# Patient Record
Sex: Female | Born: 1990 | Race: White | Hispanic: No | Marital: Married | Smoking: Never smoker
Health system: Southern US, Community
[De-identification: ages and names within clinical notes are randomized; demographics above are authoritative.]

## PROBLEM LIST (undated history)

## (undated) DIAGNOSIS — E8881 Metabolic syndrome: Secondary | ICD-10-CM

## (undated) DIAGNOSIS — R569 Unspecified convulsions: Secondary | ICD-10-CM

## (undated) DIAGNOSIS — E282 Polycystic ovarian syndrome: Secondary | ICD-10-CM

## (undated) HISTORY — DX: Metabolic syndrome: E88.81

## (undated) HISTORY — DX: Unspecified convulsions: R56.9

## (undated) HISTORY — DX: Metabolic syndrome: E88.810

## (undated) HISTORY — DX: Polycystic ovarian syndrome: E28.2

---
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
## 2013-01-28 ENCOUNTER — Ambulatory Visit (INDEPENDENT_AMBULATORY_CARE_PROVIDER_SITE_OTHER): Payer: BC Managed Care – PPO | Admitting: Physician Assistant

## 2013-01-28 VITALS — BP 136/79 | HR 114 | Temp 98.9°F

## 2013-01-28 DIAGNOSIS — S92301A Fracture of unspecified metatarsal bone(s), right foot, initial encounter for closed fracture: Secondary | ICD-10-CM

## 2013-01-28 DIAGNOSIS — S92309A Fracture of unspecified metatarsal bone(s), unspecified foot, initial encounter for closed fracture: Secondary | ICD-10-CM

## 2013-01-28 NOTE — Patient Instructions (Signed)
Foot Fracture Your caregiver has diagnosed you as having a foot fracture (broken bone). Your foot has many bones. You have a fracture, or break, in one of these bones. In some cases, your doctor may put on a splint or removable fracture boot until the swelling in your foot has lessened. A cast may or may not be required. HOME CARE INSTRUCTIONS  If you do not have a cast or splint:  You may bear weight on your injured foot as tolerated or advised.  Do not put any weight on your injured foot for as long as directed by your caregiver. Slowly increase the amount of time you walk on the foot as the pain and swelling allows or as advised.  Use crutches until you can bear weight without pain. A gradual increase in weight bearing may help.  Apply ice to the injury for 15-20 minutes each hour while awake for the first 2 days. Put the ice in a plastic bag and place a towel between the bag of ice and your skin.  If an ace bandage (stretchy, elastic wrapping bandage) was applied, you may re-wrap it if ankle is more painful or your toes become cold and swollen. If you have a cast or splint:  Use your crutches for as long as directed by your caregiver.  To lessen the swelling, keep the injured foot elevated on pillows while lying down or sitting. Elevate your foot above your heart.  Apply ice to the injury for 15-20 minutes each hour while awake for the first 2 days. Put the ice in a plastic bag and place a thin towel between the bag of ice and your cast.  Plaster or fiberglass cast:  Do not try to scratch the skin under the cast using a sharp or pointed object down the cast.  Check the skin around the cast every day. You may put lotion on any red or sore areas.  Keep your cast clean and dry.  Plaster splint:  Wear the splint until you are seen for a follow-up examination.  You may loosen the elastic around the splint if your toes become numb, tingle, or turn blue or cold. Do not rest it on  anything harder than a pillow in the first 24 hours.  Do not put pressure on any part of your splint. Use your crutches as directed.  Keep your splint dry. It can be protected during bathing with a plastic bag. Do not lower the splint into water.  If you have a fracture boot you may remove it to shower. Bear weight only as instructed by your caregiver.  Only take over-the-counter or prescription medicines for pain, discomfort, or fever as directed by your caregiver. SEEK IMMEDIATE MEDICAL CARE IF:   Your cast gets damaged or breaks.  You have continued severe pain or more swelling than you did before the cast was put on.  Your skin or nails of your casted foot turn blue, gray, feel cold or numb.  There is a bad smell from your cast.  There is severe pain with movement of your toes.  There are new stains and/or drainage coming from under the cast. MAKE SURE YOU:   Understand these instructions.  Will watch your condition.  Will get help right away if you are not doing well or get worse. Document Released: 07/29/2000 Document Revised: 10/24/2011 Document Reviewed: 09/04/2008 [employer] Patient Information 2014 [employer].  

## 2013-01-28 NOTE — Progress Notes (Signed)
Subjective:     Patient ID: Theresa Lloyd, female   DOB: 04-07-1991, 22 y.o.   MRN: [id number]  Foot Pain  Pt seen as [place] pt this am [place] pt was at beach and tripped coming down the last 2 steps She noted pain and swelling to the midfoot Due to cont sx was seen at Urgent Care/ER [place] the beach Informed she has a fx and was placed in splint Here for f/u She has had some pain to the heel in the splint   Review of Systems  All other systems reviewed and are negative.       Objective:   Physical Exam  Nursing note and vitals reviewed.  Removed splint and padded heel Good pulses + TTP mid foot + ecchy to the base of the 2-4th toes Good pulses/sensory Reviewed Xray results     Assessment:     1. Metatarsal bone fracture, right, closed, initial encounter        Plan:     Splint care reviewed Work note for pt Referral to Ortho F/U prn

## 2013-05-01 ENCOUNTER — Ambulatory Visit (INDEPENDENT_AMBULATORY_CARE_PROVIDER_SITE_OTHER): Payer: Medicaid Other | Admitting: Family Medicine

## 2013-05-01 ENCOUNTER — Encounter: Payer: Self-pay | Admitting: Family Medicine

## 2013-05-01 VITALS — BP 99/61 | HR 107 | Temp 97.3°F | Wt 309.2 lb

## 2013-05-01 DIAGNOSIS — K5289 Other specified noninfective gastroenteritis and colitis: Secondary | ICD-10-CM

## 2013-05-01 DIAGNOSIS — K529 Noninfective gastroenteritis and colitis, unspecified: Secondary | ICD-10-CM

## 2013-05-01 MED ORDER — ONDANSETRON HCL 8 MG PO TABS: 8.0000 mg | ORAL_TABLET | Freq: Three times a day (TID) | ORAL | Status: DC | PRN

## 2013-05-01 NOTE — Patient Instructions (Signed)

## 2013-05-01 NOTE — Progress Notes (Signed)
  Subjective:    Patient ID: Theresa Lloyd, female    DOB: January 30, 1991, 22 y.o.   MRN: [id number]  HPI This 22 y.o. female presents for evaluation of nausea vomiting, and diarrhea For 2 days.  She is [redacted] weeks pregnant.  She states the morning sickness Quit over 3 weeks ago.   Review of Systems C/o nausea and vomiting No chest pain, SOB, HA, dizziness, vision change, diarrhea, constipation, dysuria, urinary urgency or frequency, myalgias, arthralgias or rash.     Objective:   Physical Exam  Vital signs noted  Well developed well nourished female.  HEENT - Head atraumatic Normocephalic                Eyes - PERRLA, Conjuctiva - clear Sclera- Clear EOMI                Ears - EAC's Wnl TM's Wnl Gross Hearing WNL                Nose - Nares patent                 Throat - oropharanx wnl Respiratory - Lungs CTA bilateral Cardiac - RRR S1 and S2 without murmur GI - Abdomen soft Nontender and bowel sounds active x 4 Extremities - No edema. Neuro - Grossly intact.      Assessment & Plan:  Noninfectious gastroenteritis and colitis - Plan: ondansetron (ZOFRAN) 8 MG tablet Push po fluids, rest, and follow up prn

## 2014-06-16 ENCOUNTER — Encounter: Payer: Self-pay | Admitting: Family Medicine

## 2014-09-11 ENCOUNTER — Emergency Department (HOSPITAL_COMMUNITY)
Admission: EM | Admit: 2014-09-11 | Discharge: 2014-09-11 | Disposition: A | Discharge status: Home / Self Care | Payer: Self-pay | Attending: Emergency Medicine | Admitting: Emergency Medicine

## 2014-09-11 ENCOUNTER — Encounter (HOSPITAL_COMMUNITY): Payer: Self-pay | Admitting: Emergency Medicine

## 2014-09-11 ENCOUNTER — Emergency Department (HOSPITAL_COMMUNITY): Payer: Self-pay

## 2014-09-11 DIAGNOSIS — Z3202 Encounter for pregnancy test, result negative: Secondary | ICD-10-CM | POA: Insufficient documentation

## 2014-09-11 DIAGNOSIS — Z880 Allergy status to penicillin: Secondary | ICD-10-CM | POA: Insufficient documentation

## 2014-09-11 DIAGNOSIS — R112 Nausea with vomiting, unspecified: Secondary | ICD-10-CM | POA: Insufficient documentation

## 2014-09-11 DIAGNOSIS — G40909 Epilepsy, unspecified, not intractable, without status epilepticus: Secondary | ICD-10-CM | POA: Insufficient documentation

## 2014-09-11 DIAGNOSIS — R0789 Other chest pain: Secondary | ICD-10-CM

## 2014-09-11 DIAGNOSIS — R0602 Shortness of breath: Secondary | ICD-10-CM

## 2014-09-11 DIAGNOSIS — Z8639 Personal history of other endocrine, nutritional and metabolic disease: Secondary | ICD-10-CM | POA: Insufficient documentation

## 2014-09-11 LAB — CBC WITH DIFFERENTIAL/PLATELET
BASOS ABS: 0 10*3/uL (ref 0.0–0.1)
Basophils Relative: 0 % (ref 0–1)
EOS PCT: 1 % (ref 0–5)
Eosinophils Absolute: 0.1 10*3/uL (ref 0.0–0.7)
HCT: 43.5 % (ref 36.0–46.0)
Hemoglobin: 14.4 g/dL (ref 12.0–15.0)
LYMPHS ABS: 2.3 10*3/uL (ref 0.7–4.0)
LYMPHS PCT: 29 % (ref 12–46)
MCH: 27.2 pg (ref 26.0–34.0)
MCHC: 33.1 g/dL (ref 30.0–36.0)
MCV: 82.1 fL (ref 78.0–100.0)
Monocytes Absolute: 0.5 10*3/uL (ref 0.1–1.0)
Monocytes Relative: 6 % (ref 3–12)
Neutro Abs: 5.1 10*3/uL (ref 1.7–7.7)
Neutrophils Relative %: 65 % (ref 43–77)
PLATELETS: 334 10*3/uL (ref 150–400)
RBC: 5.3 MIL/uL — ABNORMAL HIGH (ref 3.87–5.11)
RDW: 13.5 % (ref 11.5–15.5)
WBC: 7.9 10*3/uL (ref 4.0–10.5)

## 2014-09-11 LAB — BASIC METABOLIC PANEL
ANION GAP: 8 (ref 5–15)
BUN: 9 mg/dL (ref 6–23)
CO2: 26 mmol/L (ref 19–32)
CREATININE: 0.72 mg/dL (ref 0.50–1.10)
Calcium: 9.3 mg/dL (ref 8.4–10.5)
Chloride: 103 mmol/L (ref 96–112)
GFR calc Af Amer: >90 mL/min (ref >90)
Glucose, Bld: 93 mg/dL (ref 70–99)
Potassium: 3.9 mmol/L (ref 3.5–5.1)
SODIUM: 137 mmol/L (ref 135–145)

## 2014-09-11 LAB — I-STAT TROPONIN, ED: TROPONIN I, POC: 0 ng/mL (ref 0.00–0.08)

## 2014-09-11 LAB — POC URINE PREG, ED: PREG TEST UR: NEGATIVE

## 2014-09-11 MED ORDER — SUCRALFATE 1 G PO TABS: 1.0000 g | ORAL_TABLET | Freq: Three times a day (TID) | ORAL | Status: DC

## 2014-09-11 MED ORDER — OMEPRAZOLE 20 MG PO CPDR: 20.0000 mg | DELAYED_RELEASE_CAPSULE | Freq: Every day | ORAL | Status: DC

## 2014-09-11 MED ORDER — GI COCKTAIL ~~LOC~~
30.0000 mL | Freq: Once | ORAL | Status: AC
  Administered 2014-09-11: 30 mL via ORAL
  Filled 2014-09-11: qty 30

## 2014-09-11 NOTE — Discharge Instructions (Signed)

## 2014-09-11 NOTE — ED Provider Notes (Signed)
CSN: [id number]     Arrival date & time 09/11/14  1528 History   First MD Initiated Contact with Patient 09/11/14 1601     Chief Complaint  Patient presents with  . Chest Pain     (Consider location/radiation/quality/duration/timing/severity/associated sxs/prior Treatment) Patient is a 24 y.o. female presenting with chest pain. The history is provided by the patient.  Chest Pain Pain location:  Substernal area Pain quality: throbbing   Pain radiates to:  L arm Pain radiates to the back: no   Pain severity:  Mild Onset quality:  Sudden Duration:  5 minutes Timing:  Intermittent Progression:  Unchanged Chronicity:  Chronic Context comment:  At rest but worse w/ eating Relieved by:  Nothing Worsened by:  Nothing tried Ineffective treatments: aspirin, cipro for uti. Associated symptoms: nausea, shortness of breath and vomiting   Associated symptoms: no abdominal pain, no back pain, no dizziness, no fatigue, no fever and no headache     Past Medical History  Diagnosis Date  . Seizures   . Metabolic syndrome    History reviewed. No pertinent past surgical history. Family History  Problem Relation Age of Onset  . Thyroid disease Mother   . Hypertension Father    History  Substance Use Topics  . Smoking status: Never Smoker   . Smokeless tobacco: Not on file  . Alcohol Use: No   OB History    Gravida Para Term Preterm AB TAB SAB Ectopic Multiple Living   1              Review of Systems  Constitutional: Negative for fever and fatigue.  HENT: Negative for congestion and drooling.   Eyes: Negative for pain.  Respiratory: Positive for shortness of breath.   Cardiovascular: Positive for chest pain.  Gastrointestinal: Positive for nausea and vomiting. Negative for abdominal pain and diarrhea.  Genitourinary: Negative for dysuria and hematuria.  Musculoskeletal: Negative for back pain, gait problem and neck pain.  Skin: Negative for color change.  Neurological:  Negative for dizziness and headaches.       Paresthesias   Hematological: Negative for adenopathy.  Psychiatric/Behavioral: Negative for behavioral problems.  All other systems reviewed and are negative.     Allergies  Amoxicillin and Sulfa antibiotics  Home Medications   Prior to Admission medications   Medication Sig Start Date End Date Taking? Authorizing Provider  levETIRAcetam (KEPPRA) 1000 MG tablet Take 1,000 mg by mouth at bedtime.    Historical Provider, MD  ondansetron (ZOFRAN) 8 MG tablet Take 1 tablet (8 mg total) by mouth every 8 (eight) hours as needed for nausea. 05/01/13   Deatra CanterWilliam J Oxford, FNP   BP 139/90 mmHg  Pulse 110  Temp(Src) 98.2 F (36.8 C) (Oral)  Resp 18  SpO2 97%  LMP   Breastfeeding? Unknown Physical Exam  Constitutional: She is oriented to person, place, and time. She appears well-developed and well-nourished.  HENT:  Head: Normocephalic.  Mouth/Throat: Oropharynx is clear and moist. No oropharyngeal exudate.  Eyes: Conjunctivae and EOM are normal. Pupils are equal, round, and reactive to light.  Neck: Normal range of motion. Neck supple.  Cardiovascular: Normal rate, regular rhythm, normal heart sounds and intact distal pulses.  Exam reveals no gallop and no friction rub.   No murmur heard. Pulmonary/Chest: Effort normal and breath sounds normal. No respiratory distress. She has no wheezes.  Abdominal: Soft. Bowel sounds are normal. There is no tenderness. There is no rebound and no guarding.  Musculoskeletal: Normal range  of motion. She exhibits no edema or tenderness.  Normal-appearing symmetric bilateral lower extremities.  Neurological: She is alert and oriented to person, place, and time.  alert, oriented x3 speech: normal in context and clarity memory: intact grossly cranial nerves II-XII: intact motor strength: full proximally and distally no involuntary movements or tremors sensation: intact to light touch diffusely  cerebellar:  finger-to-nose and heel-to-shin intact gait: normal forwards and backwards  Skin: Skin is warm and dry.  Psychiatric: She has a normal mood and affect. Her behavior is normal.  Nursing note and vitals reviewed.   ED Course  Procedures (including critical care time) Labs Review Labs Reviewed  CBC WITH DIFFERENTIAL/PLATELET - Abnormal; Notable for the following:    RBC 5.30 (*)    All other components within normal limits  BASIC METABOLIC PANEL  I-STAT TROPOININ, ED  POC URINE PREG, ED    Imaging Review Dg Chest 2 View  09/11/2014   CLINICAL DATA:  Chest pain. Left arm tingling. Short of breath. Headaches. Symptoms for 1.5 months.  EXAM: CHEST  2 VIEW  COMPARISON:  09/09/2014  FINDINGS: The heart size and mediastinal contours are within normal limits. Both lungs are clear. The visualized skeletal structures are unremarkable.  IMPRESSION: No active cardiopulmonary disease.   Electronically Signed   By: Maryclare Bean M.D.   On: 09/11/2014 15:59     EKG Interpretation   Date/Time:  Thursday September 11 2014 15:34:38 EST Ventricular Rate:  116 PR Interval:  142 QRS Duration: 78 QT Interval:  332 QTC Calculation: 461 R Axis:   104 Text Interpretation:  Sinus tachycardia Rightward axis Nonspecific ST and  T wave abnormality no previous Confirmed by Emersyn Wyss  MD, Cambryn Charters ([id number])  on 09/11/2014 4:51:21 PM      MDM   Final diagnoses:  Other chest pain  SOB (shortness of breath)    4:25 PM 24 y.o. female with a history of seizures on Keppra who presents with intermittent chest pain and shortness of breath over the last month and a half. She states that she also occasionally has some left arm tingling as well as nausea and vomiting. She denies any fevers. She is afebrile and initial vital signs show a mild tachycardia. She notes that her symptoms usually only last for about 5 minutes and then resolve spontaneously. She has several episodes per day. Last episode was around noon. She's been  seen by her PCP and at [facility] several times. She is currently on Cipro twice a day for 10 days for a UTI. She denies any urinary symptoms currently. She denies any fevers. She notes her symptoms are worse with lying supine and after eating. She does state that she takes Zantac but I suspect her symptoms are due to reflux. Screening labs sent prior to my evaluation. We'll give her a GI cocktail although she is asymptomatic here.  5:16 PM: I interpreted/reviewed the labs and/or imaging which were non-contributory.  Normal neuro exam. Sx sound to be gi related. Will start omeprazole and Carafate. I have a low suspicion for PE. She is low risk per [place]. She is not tachycardic on my exam and her symptoms are intermittent. I have discussed the diagnosis/risks/treatment options with the patient and believe the pt to be eligible for discharge home to follow-up with her pcp in 1 week. We also discussed returning to the ED immediately if new or worsening sx occur. We discussed the sx which are most concerning (e.g., worsening pain, sob, fever, inc  emesis) that necessitate immediate return. Medications administered to the patient during their visit and any new prescriptions provided to the patient are listed below.  Medications given during this visit Medications  gi cocktail (Maalox,Lidocaine,Donnatal) (30 mLs Oral Given 09/11/14 1635)    New Prescriptions   OMEPRAZOLE (PRILOSEC) 20 MG CAPSULE    Take 1 capsule (20 mg total) by mouth daily.   SUCRALFATE (CARAFATE) 1 G TABLET    Take 1 tablet (1 g total) by mouth 4 (four) times daily -  with meals and at bedtime.     Purvis Sheffield, MD 09/11/14 [phone]

## 2014-09-11 NOTE — ED Notes (Signed)
Pt c/o mid sternal to left sided CP with radiation down left arm with heaviness; [facility]; pt sts intermittent x 1 month

## 2016-09-16 ENCOUNTER — Telehealth: Payer: Self-pay | Admitting: Family Medicine

## 2016-09-16 NOTE — Telephone Encounter (Signed)
Shot record in drawer ready to be picked up

## 2016-09-23 ENCOUNTER — Other Ambulatory Visit (HOSPITAL_COMMUNITY): Payer: Self-pay | Admitting: Optometry

## 2016-09-23 DIAGNOSIS — H4710 Unspecified papilledema: Secondary | ICD-10-CM

## 2016-09-23 DIAGNOSIS — H47022 Hemorrhage in optic nerve sheath, left eye: Secondary | ICD-10-CM

## 2016-09-28 ENCOUNTER — Ambulatory Visit (HOSPITAL_COMMUNITY)
Admission: RE | Admit: 2016-09-28 | Discharge: 2016-09-28 | Disposition: A | Discharge status: Home / Self Care | Payer: PRIVATE HEALTH INSURANCE | Source: Ambulatory Visit | Attending: Optometry | Admitting: Optometry

## 2016-09-28 DIAGNOSIS — H4710 Unspecified papilledema: Secondary | ICD-10-CM

## 2016-09-28 DIAGNOSIS — H47022 Hemorrhage in optic nerve sheath, left eye: Secondary | ICD-10-CM

## 2016-09-30 ENCOUNTER — Other Ambulatory Visit (HOSPITAL_COMMUNITY): Payer: Self-pay | Admitting: Optometry

## 2016-10-07 ENCOUNTER — Ambulatory Visit (HOSPITAL_COMMUNITY)
Admission: RE | Admit: 2016-10-07 | Discharge: 2016-10-07 | Disposition: A | Discharge status: Home / Self Care | Payer: PRIVATE HEALTH INSURANCE | Source: Ambulatory Visit | Attending: Optometry | Admitting: Optometry

## 2016-10-07 ENCOUNTER — Ambulatory Visit (HOSPITAL_COMMUNITY): Admission: RE | Admit: 2016-10-07 | Payer: PRIVATE HEALTH INSURANCE | Source: Ambulatory Visit

## 2016-10-07 ENCOUNTER — Other Ambulatory Visit (HOSPITAL_COMMUNITY): Payer: Self-pay | Admitting: Optometry

## 2016-10-07 ENCOUNTER — Other Ambulatory Visit (HOSPITAL_COMMUNITY): Payer: PRIVATE HEALTH INSURANCE

## 2016-10-07 ENCOUNTER — Encounter (HOSPITAL_COMMUNITY): Payer: Self-pay

## 2016-10-07 DIAGNOSIS — H47022 Hemorrhage in optic nerve sheath, left eye: Secondary | ICD-10-CM

## 2016-10-07 DIAGNOSIS — H4710 Unspecified papilledema: Secondary | ICD-10-CM

## 2018-01-29 DIAGNOSIS — Z6841 Body Mass Index (BMI) 40.0 and over, adult: Secondary | ICD-10-CM | POA: Diagnosis not present

## 2018-01-29 DIAGNOSIS — E282 Polycystic ovarian syndrome: Secondary | ICD-10-CM | POA: Diagnosis not present

## 2018-02-02 DIAGNOSIS — R5381 Other malaise: Secondary | ICD-10-CM | POA: Diagnosis not present

## 2018-03-03 DIAGNOSIS — L0291 Cutaneous abscess, unspecified: Secondary | ICD-10-CM | POA: Diagnosis not present

## 2018-04-16 ENCOUNTER — Encounter (HOSPITAL_COMMUNITY): Payer: Self-pay | Admitting: *Deleted

## 2018-04-16 ENCOUNTER — Emergency Department (HOSPITAL_COMMUNITY): Payer: Commercial Managed Care - PPO

## 2018-04-16 ENCOUNTER — Emergency Department (HOSPITAL_COMMUNITY)
Admission: EM | Admit: 2018-04-16 | Discharge: 2018-04-16 | Disposition: A | Discharge status: Home / Self Care | Payer: Commercial Managed Care - PPO | Attending: Emergency Medicine | Admitting: Emergency Medicine

## 2018-04-16 ENCOUNTER — Other Ambulatory Visit: Payer: Self-pay

## 2018-04-16 DIAGNOSIS — R0600 Dyspnea, unspecified: Secondary | ICD-10-CM | POA: Diagnosis not present

## 2018-04-16 DIAGNOSIS — Z79899 Other long term (current) drug therapy: Secondary | ICD-10-CM | POA: Diagnosis not present

## 2018-04-16 DIAGNOSIS — Z7982 Long term (current) use of aspirin: Secondary | ICD-10-CM | POA: Diagnosis not present

## 2018-04-16 DIAGNOSIS — R0789 Other chest pain: Secondary | ICD-10-CM | POA: Insufficient documentation

## 2018-04-16 DIAGNOSIS — R0602 Shortness of breath: Secondary | ICD-10-CM | POA: Diagnosis not present

## 2018-04-16 LAB — COMPREHENSIVE METABOLIC PANEL
ALBUMIN: 3.9 g/dL (ref 3.5–5.0)
ALK PHOS: 76 U/L (ref 38–126)
ALT: 23 U/L (ref 0–44)
AST: 18 U/L (ref 15–41)
Anion gap: 7 (ref 5–15)
BILIRUBIN TOTAL: 0.5 mg/dL (ref 0.3–1.2)
BUN: 8 mg/dL (ref 6–20)
CO2: 24 mmol/L (ref 22–32)
Calcium: 8.8 mg/dL — ABNORMAL LOW (ref 8.9–10.3)
Chloride: 109 mmol/L (ref 98–111)
Creatinine, Ser: 0.65 mg/dL (ref 0.44–1.00)
GFR calc Af Amer: >60 mL/min (ref >60)
GFR calc non Af Amer: >60 mL/min (ref >60)
GLUCOSE: 113 mg/dL — AB (ref 70–99)
Potassium: 3.9 mmol/L (ref 3.5–5.1)
Sodium: 140 mmol/L (ref 135–145)
Total Protein: 6.9 g/dL (ref 6.5–8.1)

## 2018-04-16 LAB — CBC WITH DIFFERENTIAL/PLATELET
BASOS ABS: 0 10*3/uL (ref 0.0–0.1)
BASOS PCT: 0 %
Eosinophils Absolute: 0.1 10*3/uL (ref 0.0–0.7)
Eosinophils Relative: 1 %
HEMATOCRIT: 41.6 % (ref 36.0–46.0)
HEMOGLOBIN: 13.6 g/dL (ref 12.0–15.0)
Lymphocytes Relative: 29 %
Lymphs Abs: 3 10*3/uL (ref 0.7–4.0)
MCH: 28.6 pg (ref 26.0–34.0)
MCHC: 32.7 g/dL (ref 30.0–36.0)
MCV: 87.6 fL (ref 78.0–100.0)
Monocytes Absolute: 0.8 10*3/uL (ref 0.1–1.0)
Monocytes Relative: 8 %
NEUTROS ABS: 6.5 10*3/uL (ref 1.7–7.7)
NEUTROS PCT: 62 %
Platelets: 290 10*3/uL (ref 150–400)
RBC: 4.75 MIL/uL (ref 3.87–5.11)
RDW: 13.4 % (ref 11.5–15.5)
WBC: 10.5 10*3/uL (ref 4.0–10.5)

## 2018-04-16 LAB — TROPONIN I: Troponin I: <0.03 ng/mL (ref <0.03)

## 2018-04-16 LAB — D-DIMER, QUANTITATIVE (NOT AT ARMC)

## 2018-04-16 LAB — I-STAT BETA HCG BLOOD, ED (MC, WL, AP ONLY): I-stat hCG, quantitative: <5 m[IU]/mL (ref <5)

## 2018-04-16 NOTE — ED Triage Notes (Signed)
Pt c/o waking up around 12 tonight with sob and left arm pain; pt has been having chills and hot flashes since she woke up

## 2018-04-16 NOTE — Discharge Instructions (Addendum)
There is no evidence of heart attack or blood clot in the lung.  Take your medications as prescribed and follow-up with your doctor.  You should reschedule your sleep apnea test.  Return to the ED if you develop chest pain, shortness of breath or any other concerns.

## 2018-04-16 NOTE — ED Provider Notes (Signed)
[facility] EMERGENCY DEPARTMENT Provider Note   CSN: [id number] Arrival date & time: 04/16/18  0128     History   Chief Complaint Chief Complaint  Patient presents with  . Shortness of Breath    HPI Theresa Lloyd is a 27 y.o. female.  Patient with morbid obesity, seizures, possible sleep apnea presenting with shortness of breath that woke her from sleep today around midnight.  States she "could not catch my breath".  This was associated with left upper arm pain which resolved after about 20 minutes.  She continues to have shortness of breath and feeling like she could not get enough air in.  Denies cough or fever but has had some chills and hot flashes since she woke up.  She normally sleeps flat.  Has a history of asthma but not on regular medications.  She does not smoke.  No leg pain or leg swelling.  She is on a birth control.  Denies any cardiac history.  States her sleep apnea test was inconclusive because she could not tolerate it and was supposed to redo it.  No weakness or numbness in her left arm.  No dizziness or lightheadedness.  The history is provided by the patient.  Shortness of Breath  Pertinent negatives include no fever, no headaches, no cough, no chest pain, no vomiting, no abdominal pain and no leg swelling.    Past Medical History:  Diagnosis Date  . Metabolic syndrome   . Seizures (HCC)     There are no active problems to display for this patient.   Past Surgical History:  Procedure Laterality Date  . CESAREAN SECTION       OB History    Gravida  1   Para      Term      Preterm      AB      Living        SAB      TAB      Ectopic      Multiple      Live Births               Home Medications    Prior to Admission medications   Medication Sig Start Date End Date Taking? Authorizing Provider  aspirin 81 MG tablet Take 81 mg by mouth daily.    [provider]  ciprofloxacin (CIPRO) 500 MG tablet Take 500 mg by  mouth 2 (two) times daily. Started medication on 09-10-14    [provider]  levETIRAcetam (KEPPRA) 1000 MG tablet Take 1,000 mg by mouth at bedtime.    [provider]  omeprazole (PRILOSEC) 20 MG capsule Take 1 capsule (20 mg total) by mouth daily. 09/11/14   Purvis Sheffield, MD  ondansetron (ZOFRAN) 8 MG tablet Take 1 tablet (8 mg total) by mouth every 8 (eight) hours as needed for nausea. Patient not taking: Reported on 09/11/2014 05/01/13   Deatra Canter, FNP  ranitidine (ZANTAC) 150 MG capsule Take 150 mg by mouth daily.    [provider]  sucralfate (CARAFATE) 1 G tablet Take 1 tablet (1 g total) by mouth 4 (four) times daily -  with meals and at bedtime. 09/11/14   Purvis Sheffield, MD    Family History Family History  Problem Relation Age of Onset  . Thyroid disease Mother   . Hypertension Father     Social History Social History   Tobacco Use  . Smoking status: Never Smoker  .  Smokeless tobacco: Never Used  Substance Use Topics  . Alcohol use: No  . Drug use: No     Allergies   Amoxicillin and Sulfa antibiotics   Review of Systems Review of Systems  Constitutional: Negative for activity change, appetite change and fever.  HENT: Negative for congestion.   Eyes: Negative for visual disturbance.  Respiratory: Positive for shortness of breath. Negative for cough and chest tightness.   Cardiovascular: Negative for chest pain and leg swelling.  Gastrointestinal: Negative for abdominal pain, nausea and vomiting.  Genitourinary: Negative for dysuria, hematuria, vaginal bleeding and vaginal discharge.  Musculoskeletal: Negative for arthralgias and myalgias.  Neurological: Negative for dizziness, weakness and headaches.   all other systems are negative except as noted in the HPI and PMH.     Physical Exam Updated Vital Signs BP 130/64 (BP Location: Right Arm)   Pulse 88   Temp 98.3 F (36.8 C) (Oral)   Resp 18   Ht 5\' 7"  (1.702 m)    Wt (!) 182.3 kg   LMP 03/16/2018   SpO2 100%   BMI 62.96 kg/m   Physical Exam  Constitutional: She is oriented to person, place, and time. She appears well-developed and well-nourished. No distress.  Morbidly obese No distress, speaking in full sentences.   HENT:  Head: Normocephalic and atraumatic.  Mouth/Throat: Oropharynx is clear and moist. No oropharyngeal exudate.  Eyes: Pupils are equal, round, and reactive to light. Conjunctivae and EOM are normal.  Neck: Normal range of motion. Neck supple.  No meningismus.  Cardiovascular: Normal rate, regular rhythm, normal heart sounds and intact distal pulses.  No murmur heard. Pulmonary/Chest: Effort normal and breath sounds normal. No respiratory distress. She has no wheezes. She exhibits no tenderness.  Abdominal: Soft. There is no tenderness. There is no rebound and no guarding.  Musculoskeletal: Normal range of motion. She exhibits no edema or tenderness.  Neurological: She is alert and oriented to person, place, and time. No cranial nerve deficit. She exhibits normal muscle tone. Coordination normal.  No ataxia on finger to nose bilaterally. No pronator drift. 5/5 strength throughout. CN 2-12 intact.Equal grip strength. Sensation intact.   Skin: Skin is warm.  Psychiatric: She has a normal mood and affect. Her behavior is normal.  Nursing note and vitals reviewed.    ED Treatments / Results  Labs (all labs ordered are listed, but only abnormal results are displayed) Labs Reviewed  COMPREHENSIVE METABOLIC PANEL - Abnormal; Notable for the following components:      Result Value   Glucose, Bld 113 (*)    Calcium 8.8 (*)    All other components within normal limits  CBC WITH DIFFERENTIAL/PLATELET  D-DIMER, QUANTITATIVE (NOT AT [facility])  TROPONIN I  TROPONIN I  I-STAT BETA HCG BLOOD, ED ([facility], [facility], [facility] ONLY)    EKG EKG Interpretation  Date/Time:  Monday April 16 2018 01:50:31 EDT Ventricular Rate:  87 PR Interval:      QRS Duration: 96 QT Interval:  385 QTC Calculation: 464 R Axis:   82 Text Interpretation:  Sinus rhythm Low voltage, precordial leads Baseline wander in lead(s) II No significant change was found Confirmed by Glynn Octave [phone]) on 04/16/2018 1:55:28 AM   Radiology Dg Chest 2 View  Result Date: 04/16/2018 CLINICAL DATA:  Shortness of breath and LEFT arm pain. Chills. History of metabolic syndrome. EXAM: CHEST - 2 VIEW COMPARISON:  Chest radiograph September 03, 2014 FINDINGS: Cardiomediastinal silhouette is normal. No pleural effusions or focal consolidations. Trachea  projects midline and there is no pneumothorax. Soft tissue planes and included osseous structures are non-suspicious. Large body habitus. IMPRESSION: Negative. Electronically Signed   By: Awilda Metro M.D.   On: 04/16/2018 03:34    Procedures Procedures (including critical care time)  Medications Ordered in ED Medications - No data to display   Initial Impression / Assessment and Plan / ED Course  I have reviewed the triage vital signs and the nursing notes.  Pertinent labs & imaging results that were available during my care of the patient were reviewed by me and considered in my medical decision making (see chart for details).    Shortness of breath that woke her from sleep with left arm pain which is since resolved.  Has had chills but no fever cough.  Her lungs are clear and she is in no distress.  No hypoxia. EKG is sinus rhythm without acute ST changes.  Chest x-ray negative.  No respiratory distress, no hypoxia or wheezing.  D-dimer negative.  Troponin negative.  Low suspicion for ACS or pulmonary embolism. HEART score 2. Will obtain second troponin for completeness of work-up.  Patient does admit to having increased stress at home due to the recent death of her grandmother as well as stress from her child's injury earlier today.  She reports feeling back to baseline with no further arm pain or shortness  of breath.  No chest pain. Second troponin negative.  Appears stable for discharge. Followup with PCP. Return precautions discussed.   Final Clinical Impressions(s) / ED Diagnoses   Final diagnoses:  Atypical chest pain  Dyspnea, unspecified type    ED Discharge Orders    None       Ivelise Castillo, Jeannett Senior, MD 04/16/18 [phone]

## 2018-06-11 DIAGNOSIS — Z6841 Body Mass Index (BMI) 40.0 and over, adult: Secondary | ICD-10-CM | POA: Diagnosis not present

## 2018-06-11 DIAGNOSIS — M25561 Pain in right knee: Secondary | ICD-10-CM | POA: Diagnosis not present

## 2018-07-26 ENCOUNTER — Encounter: Payer: Self-pay | Admitting: Obstetrics & Gynecology

## 2018-08-14 ENCOUNTER — Ambulatory Visit: Payer: Commercial Managed Care - PPO | Admitting: Obstetrics & Gynecology

## 2018-08-14 ENCOUNTER — Encounter: Payer: Self-pay | Admitting: Obstetrics & Gynecology

## 2018-08-14 VITALS — BP 152/99 | HR 104 | Ht 67.0 in | Wt 395.8 lb

## 2018-08-14 DIAGNOSIS — N912 Amenorrhea, unspecified: Secondary | ICD-10-CM

## 2018-08-14 MED ORDER — MEDROXYPROGESTERONE ACETATE 10 MG PO TABS: 10.0000 mg | ORAL_TABLET | Freq: Every day | ORAL | 0 refills | Status: AC

## 2018-08-14 NOTE — Progress Notes (Signed)
Chief Complaint  Patient presents with  . referred for PCOS      27 y.o. G1P1001 No LMP recorded. (Menstrual status: Irregular Periods). The current method of family planning is vasectomy.  Outpatient Encounter Medications as of 08/14/2018  Medication Sig  . Amoxicillin-Pot Clavulanate (AUGMENTIN PO) Take by mouth 2 (two) times daily.  Marland Kitchen. Fexofenadine HCl (ALLEGRA PO) Take by mouth as needed.  . levETIRAcetam (KEPPRA) 1000 MG tablet Take 1,000 mg by mouth at bedtime.  . metFORMIN (GLUCOPHAGE-XR) 500 MG 24 hr tablet Take 500 mg by mouth daily with breakfast.   . Multiple Vitamin (MULTIVITAMIN) tablet Take 1 tablet by mouth daily.   Marland Kitchen. UNABLE TO FIND Predisone po-use as directed  . medroxyPROGESTERone (PROVERA) 10 MG tablet Take 1 tablet (10 mg total) by mouth daily.  . [DISCONTINUED] aspirin 81 MG tablet Take 81 mg by mouth daily.  . [DISCONTINUED] ciprofloxacin (CIPRO) 500 MG tablet Take 500 mg by mouth 2 (two) times daily. Started medication on 09-10-14  . [DISCONTINUED] omeprazole (PRILOSEC) 20 MG capsule Take 1 capsule (20 mg total) by mouth daily.  . [DISCONTINUED] ondansetron (ZOFRAN) 8 MG tablet Take 1 tablet (8 mg total) by mouth every 8 (eight) hours as needed for nausea. (Patient not taking: Reported on 09/11/2014)  . [DISCONTINUED] ranitidine (ZANTAC) 150 MG capsule Take 150 mg by mouth daily.  . [DISCONTINUED] sucralfate (CARAFATE) 1 G tablet Take 1 tablet (1 g total) by mouth 4 (four) times daily -  with meals and at bedtime.   No facility-administered encounter medications on file as of 08/14/2018.     Subjective Pt without menses 4 months or so, labs consistent with PCOS but really due to 120 pounds of weight gain Past Medical History:  Diagnosis Date  . Metabolic syndrome   . PCOS (polycystic ovarian syndrome)   . Seizures (HCC)     Past Surgical History:  Procedure Laterality Date  . CESAREAN SECTION  2015    OB History    Gravida  1   Para  1   Term  1   Preterm      AB      Living  1     SAB      TAB      Ectopic      Multiple      Live Births  1           Allergies  Allergen Reactions  . Amoxicillin     Unknown   . Sulfa Antibiotics     Hives     Social History   Socioeconomic History  . Marital status: Single    Spouse name: Not on file  . Number of children: Not on file  . Years of education: Not on file  . Highest education level: Not on file  Occupational History  . Not on file  Social Needs  . Financial resource strain: Not on file  . Food insecurity:    Worry: Not on file    Inability: Not on file  . Transportation needs:    Medical: Not on file    Non-medical: Not on file  Tobacco Use  . Smoking status: Never Smoker  . Smokeless tobacco: Never Used  Substance and Sexual Activity  . Alcohol use: No  . Drug use: No  . Sexual activity: Yes    Birth control/protection: Surgical    Comment: partner had vasectomy  Lifestyle  . Physical activity:  Days per week: Not on file    Minutes per session: Not on file  . Stress: Not on file  Relationships  . Social connections:    Talks on phone: Not on file    Gets together: Not on file    Attends religious service: Not on file    Active member of club or organization: Not on file    Attends meetings of clubs or organizations: Not on file    Relationship status: Not on file  Other Topics Concern  . Not on file  Social History Narrative  . Not on file    Family History  Problem Relation Age of Onset  . Thyroid disease Mother   . Hypertension Father   . Diabetes Sister   . Hypertension Brother   . Hypertension Brother   . Cancer Paternal Grandmother   . Congestive Heart Failure Paternal Grandmother   . Congestive Heart Failure Maternal Grandmother   . Cancer Maternal Grandfather        lung  . Asthma Daughter     Medications:       Current Outpatient Medications:  .  Amoxicillin-Pot Clavulanate (AUGMENTIN PO), Take by  mouth 2 (two) times daily., Disp: , Rfl:  .  Fexofenadine HCl (ALLEGRA PO), Take by mouth as needed., Disp: , Rfl:  .  levETIRAcetam (KEPPRA) 1000 MG tablet, Take 1,000 mg by mouth at bedtime., Disp: , Rfl:  .  metFORMIN (GLUCOPHAGE-XR) 500 MG 24 hr tablet, Take 500 mg by mouth daily with breakfast. , Disp: , Rfl:  .  Multiple Vitamin (MULTIVITAMIN) tablet, Take 1 tablet by mouth daily. , Disp: , Rfl:  .  UNABLE TO FIND, Predisone po-use as directed, Disp: , Rfl:  .  medroxyPROGESTERone (PROVERA) 10 MG tablet, Take 1 tablet (10 mg total) by mouth daily., Disp: 10 tablet, Rfl: 0  Objective Blood pressure (!) 152/99, pulse (!) 104, height 5\' 7"  (1.702 m), weight (!) 395 lb 12.8 oz (179.5 kg), not currently breastfeeding.  Gen Obese female NAD  Pertinent ROS   Labs or studies     Impression Diagnoses this Encounter::   ICD-10-CM   1. Amenorrhea N91.2     Established relevant diagnosis(es):   Plan/Recommendations: Meds ordered this encounter  Medications  . medroxyPROGESTERone (PROVERA) 10 MG tablet    Sig: Take 1 tablet (10 mg total) by mouth daily.    Dispense:  10 tablet    Refill:  0    Labs or Scans Ordered: No orders of the defined types were placed in this encounter.   Management:: >took about 20 minutes and discussed diet, recommended Eat to Live and The end of dieting  Follow up Return in about 3 weeks (around 09/04/2018) for Follow up, with Dr Despina HiddenEure.      All questions were answered.

## 2018-09-04 ENCOUNTER — Encounter: Payer: Self-pay | Admitting: Obstetrics & Gynecology

## 2018-09-04 ENCOUNTER — Other Ambulatory Visit: Payer: Self-pay

## 2018-09-04 ENCOUNTER — Ambulatory Visit: Payer: Commercial Managed Care - PPO | Admitting: Obstetrics & Gynecology

## 2018-09-04 VITALS — BP 127/75 | HR 83 | Ht 67.0 in | Wt 389.0 lb

## 2018-09-04 DIAGNOSIS — N912 Amenorrhea, unspecified: Secondary | ICD-10-CM | POA: Diagnosis not present

## 2018-09-04 MED ORDER — MEDROXYPROGESTERONE ACETATE 10 MG PO TABS
10.0000 mg | ORAL_TABLET | Freq: Every day | ORAL | 11 refills | Status: DC
Start: 2018-09-04 — End: 2018-09-29

## 2018-09-04 NOTE — Progress Notes (Signed)
Chief Complaint  Patient presents with  . Consult    ?PCOS      27 y.o. G1P1001 No LMP recorded. (Menstrual status: Irregular Periods). The current method of family planning is vasectomy.  Outpatient Encounter Medications as of 09/04/2018  Medication Sig  . Fexofenadine HCl (ALLEGRA PO) Take by mouth as needed.  . levETIRAcetam (KEPPRA) 1000 MG tablet Take 1,000 mg by mouth at bedtime.  . metFORMIN (GLUCOPHAGE-XR) 500 MG 24 hr tablet Take 500 mg by mouth daily with breakfast.   . Multiple Vitamin (MULTIVITAMIN) tablet Take 1 tablet by mouth daily.   . medroxyPROGESTERone (PROVERA) 10 MG tablet Take 1 tablet (10 mg total) by mouth daily. (Patient not taking: Reported on 09/04/2018)  . [DISCONTINUED] Amoxicillin-Pot Clavulanate (AUGMENTIN PO) Take by mouth 2 (two) times daily.  . [DISCONTINUED] UNABLE TO FIND Predisone po-use as directed   No facility-administered encounter medications on file as of 09/04/2018.     Subjective Pt had a cycle s/p provera not too bad of bleeding Will continue to cycle monthly Past Medical History:  Diagnosis Date  . Metabolic syndrome   . PCOS (polycystic ovarian syndrome)   . Seizures (HCC)     Past Surgical History:  Procedure Laterality Date  . CESAREAN SECTION  2015    OB History    Gravida  1   Para  1   Term  1   Preterm      AB      Living  1     SAB      TAB      Ectopic      Multiple      Live Births  1           Allergies  Allergen Reactions  . Amoxicillin     Unknown   . Sulfa Antibiotics     Hives     Social History   Socioeconomic History  . Marital status: Single    Spouse name: Not on file  . Number of children: Not on file  . Years of education: Not on file  . Highest education level: Not on file  Occupational History  . Not on file  Social Needs  . Financial resource strain: Not on file  . Food insecurity:    Worry: Not on file    Inability: Not on file  . Transportation  needs:    Medical: Not on file    Non-medical: Not on file  Tobacco Use  . Smoking status: Never Smoker  . Smokeless tobacco: Never Used  Substance and Sexual Activity  . Alcohol use: No  . Drug use: No  . Sexual activity: Yes    Birth control/protection: Surgical    Comment: partner had vasectomy  Lifestyle  . Physical activity:    Days per week: Not on file    Minutes per session: Not on file  . Stress: Not on file  Relationships  . Social connections:    Talks on phone: Not on file    Gets together: Not on file    Attends religious service: Not on file    Active member of club or organization: Not on file    Attends meetings of clubs or organizations: Not on file    Relationship status: Not on file  Other Topics Concern  . Not on file  Social History Narrative  . Not on file    Family History  Problem Relation Age of Onset  .  Thyroid disease Mother   . Hypertension Father   . Diabetes Sister   . Hypertension Brother   . Hypertension Brother   . Cancer Paternal Grandmother   . Congestive Heart Failure Paternal Grandmother   . Congestive Heart Failure Maternal Grandmother   . Cancer Maternal Grandfather        lung  . Asthma Daughter     Medications:       Current Outpatient Medications:  .  Fexofenadine HCl (ALLEGRA PO), Take by mouth as needed., Disp: , Rfl:  .  levETIRAcetam (KEPPRA) 1000 MG tablet, Take 1,000 mg by mouth at bedtime., Disp: , Rfl:  .  metFORMIN (GLUCOPHAGE-XR) 500 MG 24 hr tablet, Take 500 mg by mouth daily with breakfast. , Disp: , Rfl:  .  Multiple Vitamin (MULTIVITAMIN) tablet, Take 1 tablet by mouth daily. , Disp: , Rfl:  .  medroxyPROGESTERone (PROVERA) 10 MG tablet, Take 1 tablet (10 mg total) by mouth daily. (Patient not taking: Reported on 09/04/2018), Disp: 10 tablet, Rfl: 0  Objective Blood pressure 127/75, pulse 83, height 5\' 7"  (1.702 m), weight (!) 389 lb (176.4 kg).  Gen obese female NAD  Pertinent ROS No burning with  urination, frequency or urgency No nausea, vomiting or diarrhea Nor fever chills or other constitutional symptoms   Labs or studies     Impression Diagnoses this Encounter::   ICD-10-CM   1. Amenorrhea N91.2     Established relevant diagnosis(es):   Plan/Recommendations: No orders of the defined types were placed in this encounter.   Labs or Scans Ordered: No orders of the defined types were placed in this encounter.   Management:: Monthly cyclical provera first 10 dys of month for endometrial protection  Follow up Return in about 1 year (around 09/05/2019).        Face to face time:  10 minutes  Greater than 50% of the visit time was spent in counseling and coordination of care with the patient.  The summary and outline of the counseling and care coordination is summarized in the note above.   All questions were answered.

## 2018-09-29 ENCOUNTER — Emergency Department (HOSPITAL_COMMUNITY)
Admission: EM | Admit: 2018-09-29 | Discharge: 2018-09-29 | Disposition: A | Discharge status: Home / Self Care | Payer: Commercial Managed Care - PPO | Attending: Emergency Medicine | Admitting: Emergency Medicine

## 2018-09-29 ENCOUNTER — Encounter (HOSPITAL_COMMUNITY): Payer: Self-pay | Admitting: Emergency Medicine

## 2018-09-29 ENCOUNTER — Emergency Department (HOSPITAL_COMMUNITY): Payer: Commercial Managed Care - PPO

## 2018-09-29 DIAGNOSIS — R202 Paresthesia of skin: Secondary | ICD-10-CM | POA: Diagnosis not present

## 2018-09-29 DIAGNOSIS — Z79899 Other long term (current) drug therapy: Secondary | ICD-10-CM | POA: Insufficient documentation

## 2018-09-29 LAB — BASIC METABOLIC PANEL
Anion gap: 9 (ref 5–15)
BUN: 8 mg/dL (ref 6–20)
CHLORIDE: 107 mmol/L (ref 98–111)
CO2: 23 mmol/L (ref 22–32)
CREATININE: 0.61 mg/dL (ref 0.44–1.00)
Calcium: 9.1 mg/dL (ref 8.9–10.3)
GFR calc Af Amer: >60 mL/min (ref >60)
GFR calc non Af Amer: >60 mL/min (ref >60)
Glucose, Bld: 91 mg/dL (ref 70–99)
POTASSIUM: 4 mmol/L (ref 3.5–5.1)
SODIUM: 139 mmol/L (ref 135–145)

## 2018-09-29 MED ORDER — NAPROXEN 500 MG PO TABS: 500.0000 mg | ORAL_TABLET | Freq: Two times a day (BID) | ORAL | 0 refills | Status: AC

## 2018-09-29 NOTE — Discharge Instructions (Signed)
Your symptoms are likely related to the injury that you had at the gym.  Your x-ray and blood work was totally normal.  Please seek medical exam within 1 week if still having symptoms to make sure this is not something more serious.  Please call your doctor to make an appointment for that on Monday morning.  Until that time you may take naproxen  Please take Naprosyn, 500mg  by mouth twice daily as needed for pain - this in an antiinflammatory medicine (NSAID) and is similar to ibuprofen - many people feel that it is stronger than ibuprofen and it is easier to take since it is a smaller pill.  Please use this only for 1 week - if your pain persists, you will need to follow up with your doctor in the office for ongoing guidance and pain control.

## 2018-09-29 NOTE — ED Provider Notes (Signed)
[facility] EMERGENCY DEPARTMENT Provider Note   CSN: [id number] Arrival date & time: 09/29/18  1101     History   Chief Complaint Chief Complaint  Patient presents with  . Chest Pain    HPI Theresa Lloyd is a 28 y.o. female.  HPI  28 year old female with a known history of prediabetes, polycystic ovarian syndrome and seizures who takes daily metformin and Keppra, she presents to the hospital with a complaint of left arm numbness and tingling.  She has had this for approximately 1 week and states that it started after she was doing an exercise at the gym where she tried to lift a very high amount of weight on a machine that she had not used frequently.  She states that she felt an acute pain in her left shoulder and since that time has had a pins and needle sensation between the shoulder and the elbow and a slight numbness from the elbow to the fingertips.  No other symptoms, no changes in vision speech balance or gait.  No difficulty with activities of daily living.  She has not taken any medications for this.  She is concerned because over the evening she developed some chest discomfort which has been persistent for the last 12 hours.  No fevers coughing shortness of breath or swelling of the legs.  Past Medical History:  Diagnosis Date  . Metabolic syndrome   . PCOS (polycystic ovarian syndrome)   . Seizures (HCC)     There are no active problems to display for this patient.   Past Surgical History:  Procedure Laterality Date  . CESAREAN SECTION  2015     OB History    Gravida  1   Para  1   Term  1   Preterm      AB      Living  1     SAB      TAB      Ectopic      Multiple      Live Births  1            Home Medications    Prior to Admission medications   Medication Sig Start Date End Date Taking? Authorizing Provider  Fexofenadine HCl (ALLEGRA PO) Take by mouth as needed.   Yes [provider]  levETIRAcetam (KEPPRA) 1000 MG  tablet Take 1,000 mg by mouth at bedtime.   Yes [provider]  medroxyPROGESTERone (PROVERA) 10 MG tablet Take 1 tablet (10 mg total) by mouth daily. Patient taking differently: Take 10 mg by mouth daily as needed. Patient takes the 1st-10th of the month 08/14/18  Yes Eure, Amaryllis Dyke, MD  metFORMIN (GLUCOPHAGE) 500 MG tablet Take 1 tablet by mouth daily. 09/05/18  Yes [provider]  Multiple Vitamin (MULTIVITAMIN) tablet Take 1 tablet by mouth daily.    Yes [provider]  naproxen (NAPROSYN) 500 MG tablet Take 1 tablet (500 mg total) by mouth 2 (two) times daily with a meal. 09/29/18   Eber Hong, MD    Family History Family History  Problem Relation Age of Onset  . Thyroid disease Mother   . Hypertension Father   . Diabetes Sister   . Hypertension Brother   . Hypertension Brother   . Cancer Paternal Grandmother   . Congestive Heart Failure Paternal Grandmother   . Congestive Heart Failure Maternal Grandmother   . Cancer Maternal Grandfather        lung  . Asthma  Daughter     Social History Social History   Tobacco Use  . Smoking status: Never Smoker  . Smokeless tobacco: Never Used  Substance Use Topics  . Alcohol use: No  . Drug use: No     Allergies   Sulfa antibiotics and Amoxicillin   Review of Systems Review of Systems  All other systems reviewed and are negative.    Physical Exam Updated Vital Signs BP 131/70   Pulse 81   Temp 97.9 F (36.6 C)   Resp (!) 21   Ht 1.676 m (5\' 6" )   Wt (!) 172.4 kg   LMP 09/25/2018   SpO2 100%   BMI 61.33 kg/m   Physical Exam Vitals signs and nursing note reviewed.  Constitutional:      General: She is not in acute distress.    Appearance: She is well-developed.  HENT:     Head: Normocephalic and atraumatic.     Mouth/Throat:     Pharynx: No oropharyngeal exudate.  Eyes:     General: No scleral icterus.       Right eye: No discharge.        Left eye: No discharge.      Conjunctiva/sclera: Conjunctivae normal.     Pupils: Pupils are equal, round, and reactive to light.  Neck:     Musculoskeletal: Normal range of motion and neck supple.     Thyroid: No thyromegaly.     Vascular: No JVD.  Cardiovascular:     Rate and Rhythm: Normal rate and regular rhythm.     Heart sounds: Normal heart sounds. No murmur. No friction rub. No gallop.   Pulmonary:     Effort: Pulmonary effort is normal. No respiratory distress.     Breath sounds: Normal breath sounds. No wheezing or rales.  Abdominal:     General: Bowel sounds are normal. There is no distension.     Palpations: Abdomen is soft. There is no mass.     Tenderness: There is no abdominal tenderness.  Musculoskeletal: Normal range of motion.        General: No tenderness.  Lymphadenopathy:     Cervical: No cervical adenopathy.  Skin:    General: Skin is warm and dry.     Findings: No erythema or rash.  Neurological:     Mental Status: She is alert.     Coordination: Coordination normal.     Comments: The patient has a very normal neurologic exam with regards to cranial nerves III through XII.  She has normal speech and coordination, she has normal strength in all 4 extremities at the major joints.  She has normal grips and denies any abnormal sensation to light touch or pinprick of the bilateral upper extremities.  Psychiatric:        Behavior: Behavior normal.      ED Treatments / Results  Labs (all labs ordered are listed, but only abnormal results are displayed) Labs Reviewed  BASIC METABOLIC PANEL    EKG EKG Interpretation  Date/Time:  Saturday September 29 2018 11:12:32 EST Ventricular Rate:  88 PR Interval:    QRS Duration: 96 QT Interval:  380 QTC Calculation: 460 R Axis:   77 Text Interpretation:  Sinus rhythm Low voltage, precordial leads since last tracing no significant change Confirmed by Mancel Bale [phone]) on 09/29/2018 11:20:55 AM   Radiology Dg Chest 2 View  Result  Date: 09/29/2018 CLINICAL DATA:  Chest pain. Left arm pain and tingling for 3-4 days. EXAM:  CHEST - 2 VIEW COMPARISON:  04/16/2018. FINDINGS: The heart size and mediastinal contours are within normal limits. Both lungs are clear. No pleural effusion or pneumothorax. The visualized skeletal structures are unremarkable. IMPRESSION: No active cardiopulmonary disease. Electronically Signed   By: Amie Portlandavid  Ormond M.D.   On: 09/29/2018 13:00    Procedures Procedures (including critical care time)  Medications Ordered in ED Medications - No data to display   Initial Impression / Assessment and Plan / ED Course  I have reviewed the triage vital signs and the nursing notes.  Pertinent labs & imaging results that were available during my care of the patient were reviewed by me and considered in my medical decision making (see chart for details).  Clinical Course as of Sep 29 1417  Sat Sep 29, 2018  1241 I have personally looked at the 2 view PA and lateral chest x-ray and find no signs of infiltrate pneumothorax or abnormal mediastinum.  Normal-appearing chest x-ray.   [BM]  1242 Vital signs reviewed, blood pressure 125 systolic with a heart rate of 66 and an oxygen of 96 or more.   [BM]  1242 Pulmonary embolism would be extremely unlikely, doubt aortic dissection, acute coronary syndrome also seems unlikely given the absence of any exertional symptoms abnormal findings on EKG etc.   [BM]  1417 Metabolic panel is totally normal as well, stable for discharge   [BM]    Clinical Course User Index [BM] Eber HongMiller, Abdiel Blackerby, MD    Cardiac and pulmonary exams as well as the neurologic exam are very normal.  I suspect that she has a neuropraxia related to her musculoskeletal injury.  EKG is totally normal.  Recommend anti-inflammatories and follow-up as needed for EMG studies if does not improve.  She has no neck pain to suggest a radiculopathy and no history of other neurologic symptoms to suggest MS despite her  age.  Trop, bmp, cxr, pt agreeable.  Final Clinical Impressions(s) / ED Diagnoses   Final diagnoses:  Paresthesia    ED Discharge Orders         Ordered    naproxen (NAPROSYN) 500 MG tablet  2 times daily with meals     09/29/18 1417           Eber HongMiller, Rolland Steinert, MD 09/29/18 1419

## 2018-09-29 NOTE — ED Triage Notes (Signed)
Pt states she has been having left arm tingling for several days with chest tightness since last night.

## 2019-06-24 IMAGING — DX DG CHEST 2V
2 series · 2 of 2 positions shown · non-contrast
Comparison: Chest radiograph September 03, 2014

CLINICAL DATA: Shortness of breath and LEFT arm pain. Chills.
History of metabolic syndrome.

EXAM:
CHEST - 2 VIEW

[chest pa]
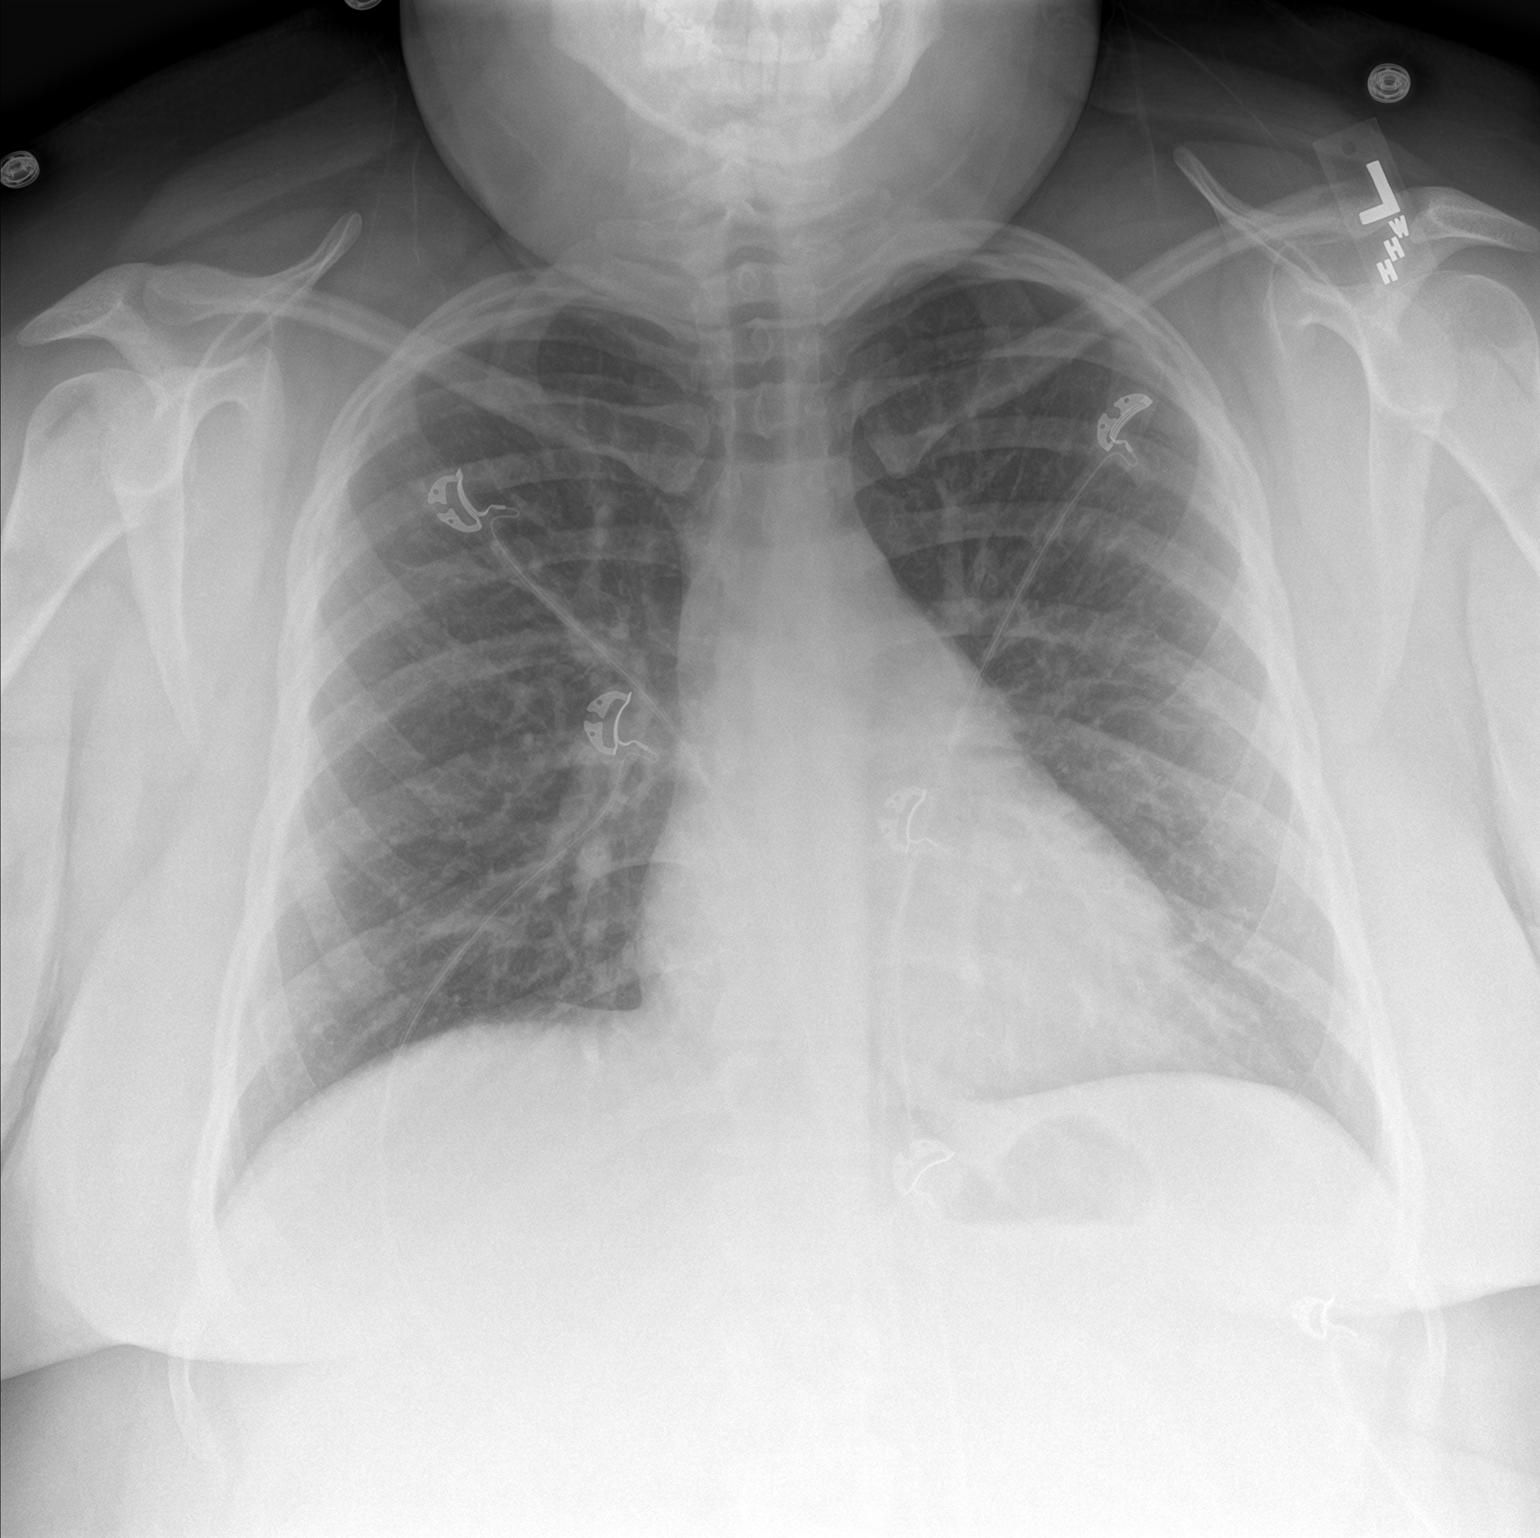

[chest lat]
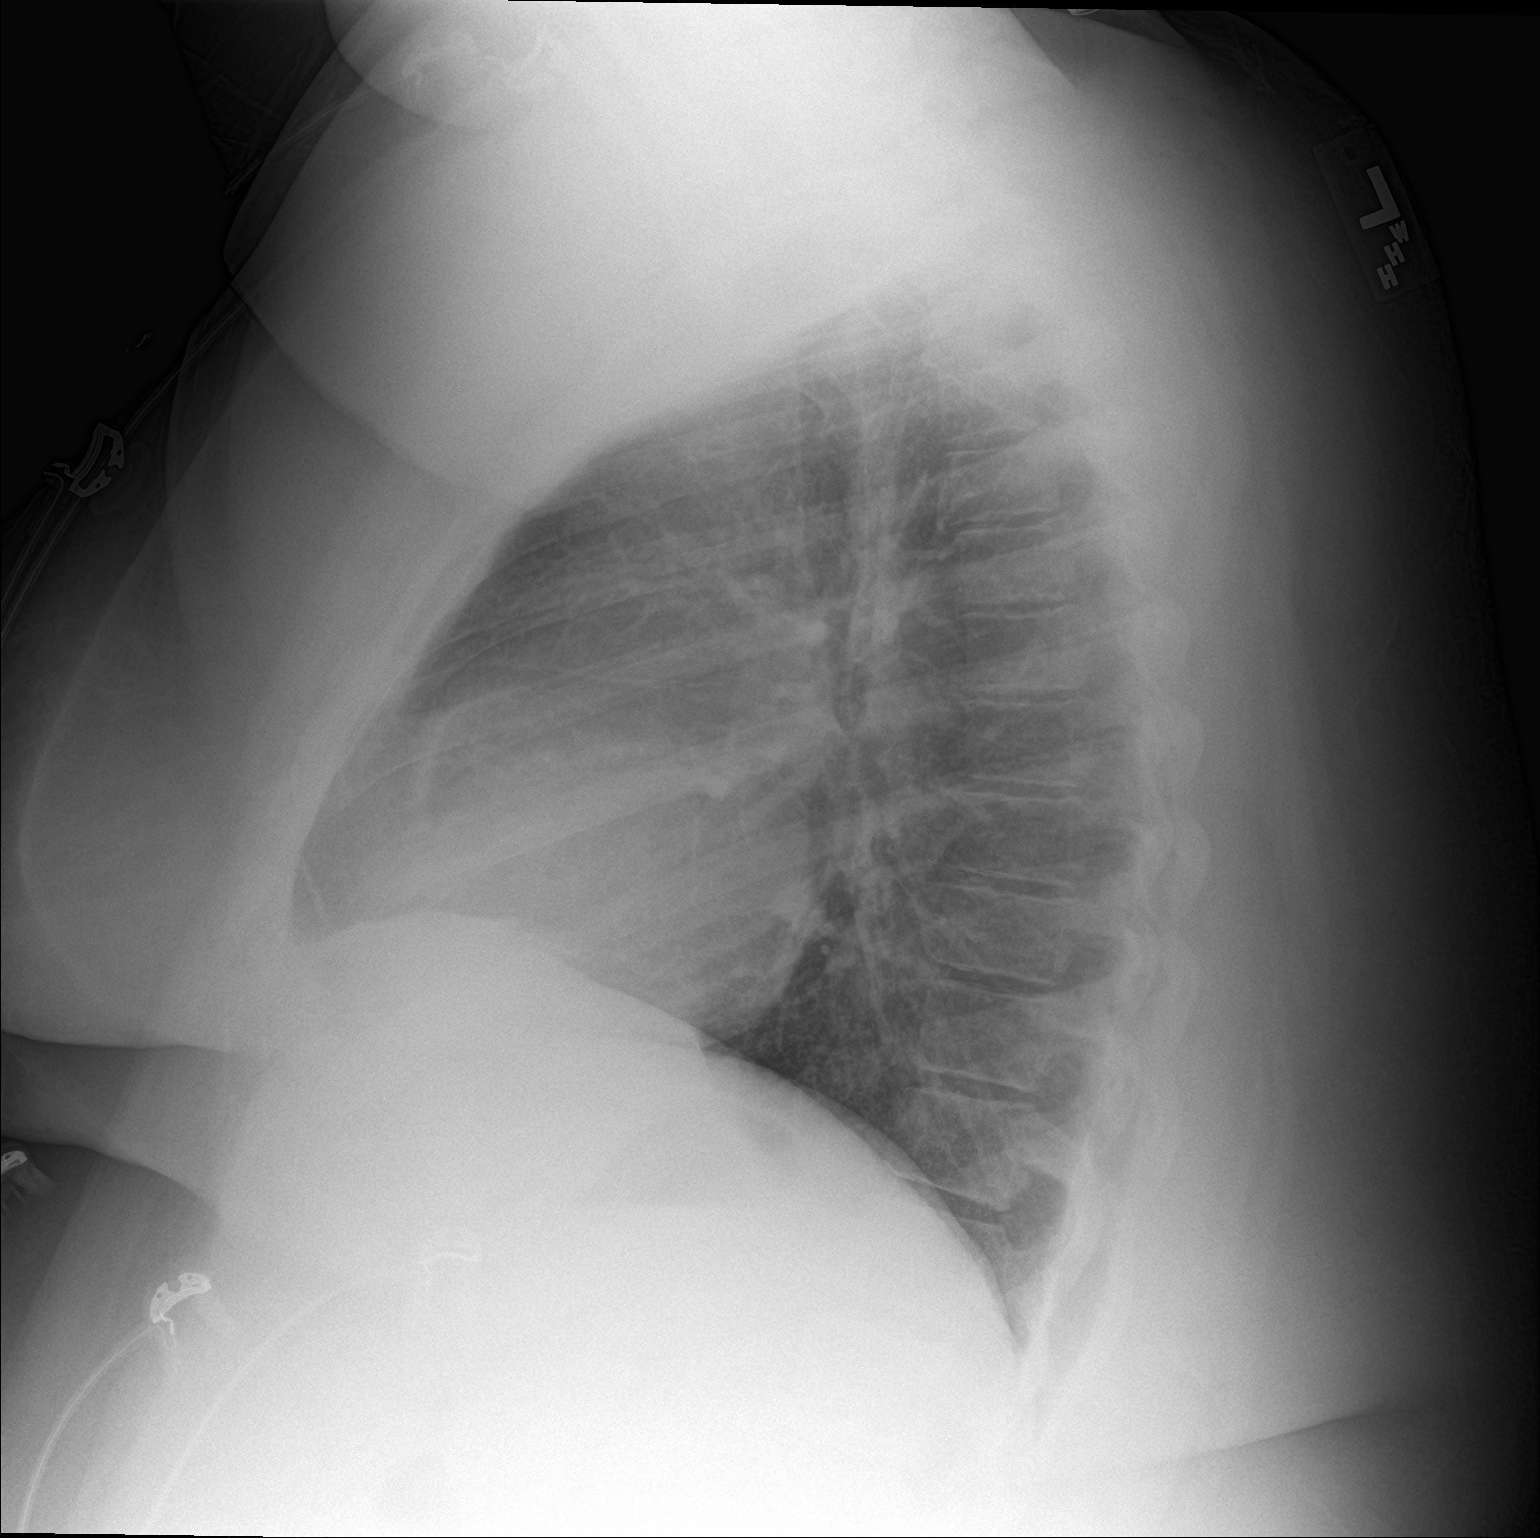

[2 of 2 positions shown; findings below may reference images not displayed]

FINDINGS: Cardiomediastinal silhouette is normal. No pleural effusions or
focal consolidations. Trachea projects midline and there is no
pneumothorax. Soft tissue planes and included osseous structures are
non-suspicious. Large body habitus.
IMPRESSION: Negative.

## 2019-12-07 IMAGING — DX DG CHEST 2V
2 series · 2 of 2 positions shown · non-contrast
Comparison: 04/16/2018.

CLINICAL DATA: Chest pain. Left arm pain and tingling for 3-4 days.

EXAM:
CHEST - 2 VIEW

[chest pa]
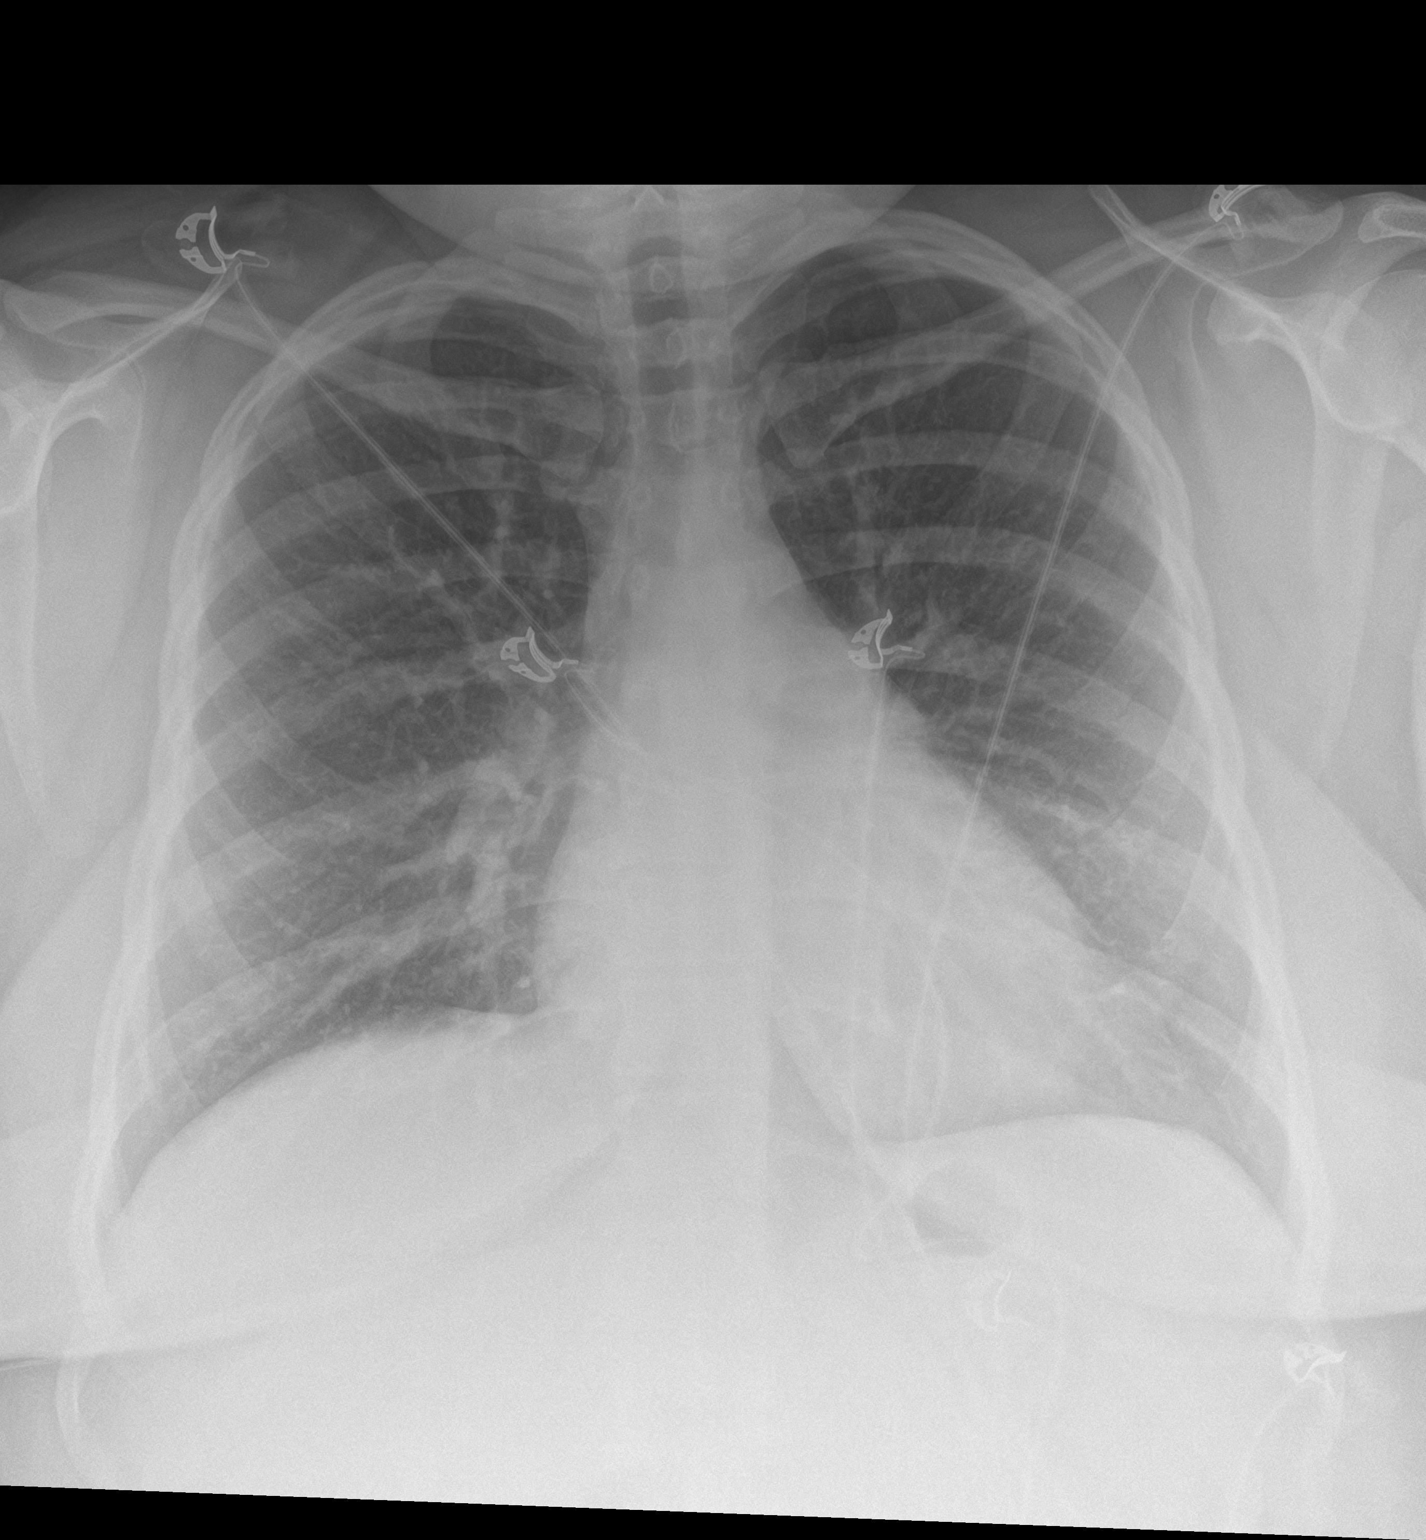

[chest lat]
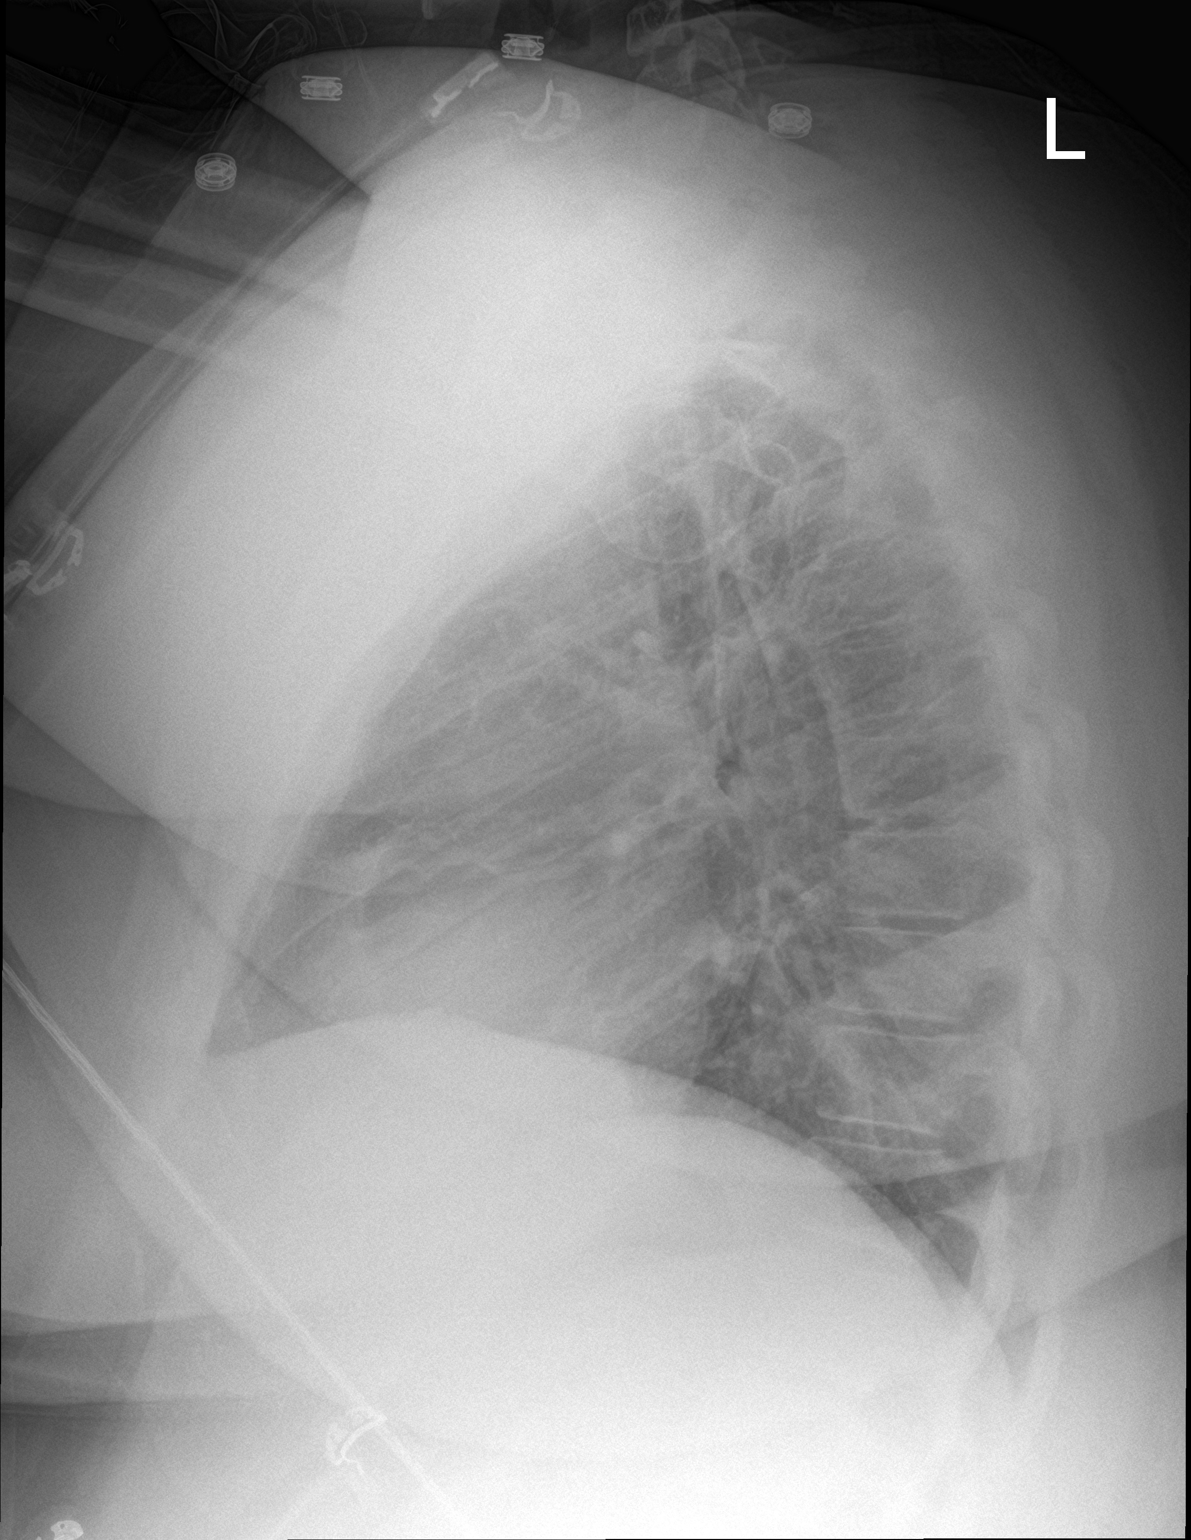

[2 of 2 positions shown; findings below may reference images not displayed]

FINDINGS: The heart size and mediastinal contours are within normal limits.
Both lungs are clear. No pleural effusion or pneumothorax. The
visualized skeletal structures are unremarkable.
IMPRESSION: No active cardiopulmonary disease.

## 2022-01-19 ENCOUNTER — Other Ambulatory Visit: Payer: Self-pay | Admitting: Ophthalmology

## 2022-01-19 DIAGNOSIS — H4710 Unspecified papilledema: Secondary | ICD-10-CM

## 2024-03-01 ENCOUNTER — Encounter: Payer: Self-pay | Admitting: Advanced Practice Midwife
